# Patient Record
Sex: Male | Born: 2000 | Race: Black or African American | Hispanic: No | Marital: Single | Smoking: Never smoker
Health system: Southern US, Community
[De-identification: ages and names within clinical notes are randomized; demographics above are authoritative.]

## PROBLEM LIST (undated history)

## (undated) DIAGNOSIS — F913 Oppositional defiant disorder: Secondary | ICD-10-CM

## (undated) HISTORY — DX: Oppositional defiant disorder: F91.3

---
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
## 2021-01-11 ENCOUNTER — Emergency Department (HOSPITAL_COMMUNITY): Payer: Medicaid Other

## 2021-01-11 ENCOUNTER — Emergency Department (HOSPITAL_COMMUNITY)
Admission: EM | Admit: 2021-01-11 | Discharge: 2021-01-11 | Disposition: A | Discharge status: Home / Self Care | Payer: Medicaid Other | Attending: Emergency Medicine | Admitting: Emergency Medicine

## 2021-01-11 DIAGNOSIS — Z131 Encounter for screening for diabetes mellitus: Secondary | ICD-10-CM | POA: Diagnosis not present

## 2021-01-11 DIAGNOSIS — R55 Syncope and collapse: Secondary | ICD-10-CM | POA: Insufficient documentation

## 2021-01-11 DIAGNOSIS — R569 Unspecified convulsions: Secondary | ICD-10-CM | POA: Insufficient documentation

## 2021-01-11 DIAGNOSIS — I10 Essential (primary) hypertension: Secondary | ICD-10-CM | POA: Diagnosis not present

## 2021-01-11 DIAGNOSIS — R030 Elevated blood-pressure reading, without diagnosis of hypertension: Secondary | ICD-10-CM | POA: Diagnosis not present

## 2021-01-11 DIAGNOSIS — E669 Obesity, unspecified: Secondary | ICD-10-CM | POA: Diagnosis not present

## 2021-01-11 DIAGNOSIS — Z1329 Encounter for screening for other suspected endocrine disorder: Secondary | ICD-10-CM | POA: Diagnosis not present

## 2021-01-11 DIAGNOSIS — E559 Vitamin D deficiency, unspecified: Secondary | ICD-10-CM | POA: Diagnosis not present

## 2021-01-11 DIAGNOSIS — Z13220 Encounter for screening for lipoid disorders: Secondary | ICD-10-CM | POA: Diagnosis not present

## 2021-01-11 DIAGNOSIS — Z723 Lack of physical exercise: Secondary | ICD-10-CM | POA: Diagnosis not present

## 2021-01-11 DIAGNOSIS — R42 Dizziness and giddiness: Secondary | ICD-10-CM | POA: Diagnosis not present

## 2021-01-11 LAB — MAGNESIUM: Magnesium: 2.1 mg/dL (ref 1.7–2.4)

## 2021-01-11 LAB — ETHANOL: Alcohol, Ethyl (B): <10 mg/dL (ref <10)

## 2021-01-11 LAB — CBC
HCT: 50.2 % (ref 39.0–52.0)
Hemoglobin: 16.3 g/dL (ref 13.0–17.0)
MCH: 29.7 pg (ref 26.0–34.0)
MCHC: 32.5 g/dL (ref 30.0–36.0)
MCV: 91.4 fL (ref 80.0–100.0)
Platelets: 309 10*3/uL (ref 150–400)
RBC: 5.49 MIL/uL (ref 4.22–5.81)
RDW: 12.4 % (ref 11.5–15.5)
WBC: 5.5 10*3/uL (ref 4.0–10.5)
nRBC: 0 % (ref 0.0–0.2)

## 2021-01-11 LAB — BASIC METABOLIC PANEL
Anion gap: 10 (ref 5–15)
BUN: 22 mg/dL — ABNORMAL HIGH (ref 6–20)
CO2: 26 mmol/L (ref 22–32)
Calcium: 10 mg/dL (ref 8.9–10.3)
Chloride: 106 mmol/L (ref 98–111)
Creatinine, Ser: 1.04 mg/dL (ref 0.61–1.24)
GFR, Estimated: >60 mL/min (ref >60)
Glucose, Bld: 97 mg/dL (ref 70–99)
Potassium: 4.2 mmol/L (ref 3.5–5.1)
Sodium: 142 mmol/L (ref 135–145)

## 2021-01-11 NOTE — ED Provider Notes (Addendum)
Emergency Medicine Provider Triage Evaluation Note  Tony Jenkins , a 20 y.o. male  was evaluated in triage.  Pt complains of syncope.  Patient was at a doctor's office and having his blood drawn.  Reportedly syncopized, with questionable shaking activity.  Doctor's office used an ammonia capsule on him and he woke up immediately.  No postictal period.  No prior history of seizures.  He is feeling well and at baseline now.  Per chart review, patient does have a history of oppositional defiant disorder and autism.  He denies any history of medication use, no recent drug or alcohol use.  The reports they were sent here by the PCPs office due to the fact that the patient actually urinated on himself.  Mother reports family history of seizures but the patient does not have any history of seizures  Review of Systems  Positive: As above Negative: As above  Physical Exam  BP (!) 143/74 (BP Location: Left Arm)   Pulse 85   Temp 99.4 F (37.4 C) (Oral)   Resp 14   SpO2 99%  Gen:   Awake, no distress   Resp:  Normal effort  MSK:   Moves extremities without difficulty  Other:  Alert and oriented x4, moving all 4 extremities, answering questions appropriately  Medical Decision Making  Medically screening exam initiated at 3:21 PM.  Appropriate orders placed.  Tony Groot was informed that the remainder of the evaluation will be completed by another provider, this initial triage assessment does not replace that evaluation, and the importance of remaining in the ED until their evaluation is complete.      Mare Ferrari, PA-C 01/11/21 1521    Melene Plan, DO 01/11/21 1622

## 2021-01-11 NOTE — ED Provider Notes (Signed)
[facility] EMERGENCY DEPARTMENT Provider Note   CSN: [id number] Arrival date & time: 01/11/21  1515     History Chief Complaint  Patient presents with  . Loss of Consciousness    Tony Jenkins is a 20 y.o. male.  Mr. Kenneth was at the family medicine clinic getting a physical exam today.  He had been seated during a blood draw, and then he was dismissed to leave the clinic.  As he was walking towards the door, he passed out.  There is a questionable account by clinic staff of the patient having motor activity, and he was incontinent of urine.  He is currently back to baseline and is asymptomatic.  The history is provided by the patient and a relative (mother).  Loss of Consciousness Episode history:  Single Most recent episode:  Today Timing:  Constant Progression:  Resolved Chronicity:  New Context: blood draw   Witnessed: yes   Relieved by: smelling salts. Worsened by:  Nothing Ineffective treatments:  None tried Associated symptoms: seizures (possible motor activity)   Associated symptoms: no chest pain, no diaphoresis, no difficulty breathing, no fever, no focal sensory loss, no focal weakness, no palpitations, no recent injury, no shortness of breath and no vomiting   Associated symptoms comment:  Urinary incontinence Risk factors: no congenital heart disease and no seizures   Risk factors comment:  No first-degree relatives with sudden cardiac death.  He does not play sports.      Past Medical History:  Diagnosis Date  . ODD (oppositional defiant disorder) 11/2005   ? unclear diagnosis evaluated at [facility] ADHD clinic, mental health and youth focus    Patient Active Problem List   Diagnosis Date Noted  . No-show for appointment 11/21/2020  . Encounter for wellness examination in adult 11/20/2020  . Gynecomastia 07/12/2016  . Essential hypertension 04/26/2016  . Eczema 03/19/2013  . Xerosis of skin 10/24/2011  . ODD (oppositional defiant  disorder)/behavioral issues 11/24/2005    No past surgical history on file.     Family History  Problem Relation Age of Onset  . Asthma Mother   . Hypertension Mother   . Asthma Brother     Social History   Tobacco Use  . Smoking status: Never Smoker  . Smokeless tobacco: Never Used    Home Medications Prior to Admission medications   Not on File    Allergies    Patient has no known allergies.  Review of Systems   Review of Systems  Constitutional: Negative for chills, diaphoresis and fever.  HENT: Negative for ear pain and sore throat.   Eyes: Negative for pain and visual disturbance.  Respiratory: Negative for cough and shortness of breath.   Cardiovascular: Positive for syncope. Negative for chest pain and palpitations.  Gastrointestinal: Negative for abdominal pain and vomiting.  Genitourinary: Negative for dysuria and hematuria.  Musculoskeletal: Negative for arthralgias and back pain.  Skin: Negative for color change and rash.  Neurological: Positive for seizures (possible motor activity). Negative for focal weakness and syncope.  All other systems reviewed and are negative.   Physical Exam Updated Vital Signs BP 134/78   Pulse 80   Temp 99.4 F (37.4 C) (Oral)   Resp 16   Ht 6\' 2"  (1.88 m)   Wt 113.4 kg   SpO2 98%   BMI 32.10 kg/m   Physical Exam Vitals and nursing note reviewed.  Constitutional:      Appearance: He is well-developed.  HENT:  Head: Normocephalic and atraumatic.  Eyes:     Conjunctiva/sclera: Conjunctivae normal.  Cardiovascular:     Rate and Rhythm: Normal rate and regular rhythm.     Heart sounds: No murmur heard.   Pulmonary:     Effort: Pulmonary effort is normal. No respiratory distress.     Breath sounds: Normal breath sounds.  Abdominal:     Palpations: Abdomen is soft.     Tenderness: There is no abdominal tenderness.  Musculoskeletal:     Cervical back: Neck supple.  Skin:    General: Skin is warm and  dry.  Neurological:     General: No focal deficit present.     Mental Status: He is alert and oriented to person, place, and time. Mental status is at baseline.     Cranial Nerves: No cranial nerve deficit.     Sensory: No sensory deficit.     Motor: No weakness.     Coordination: Coordination normal.  Psychiatric:        Mood and Affect: Mood normal.        Behavior: Behavior normal.     ED Results / Procedures / Treatments   Labs (all labs ordered are listed, but only abnormal results are displayed) Labs Reviewed  BASIC METABOLIC PANEL - Abnormal; Notable for the following components:      Result Value   BUN 22 (*)    All other components within normal limits  CBC  MAGNESIUM  ETHANOL  RAPID URINE DRUG SCREEN, HOSP PERFORMED  URINALYSIS, ROUTINE W REFLEX MICROSCOPIC    EKG EKG Interpretation  Date/Time:  Thursday Jan 11 2021 15:20:21 EDT Ventricular Rate:  95 PR Interval:  128 QRS Duration: 82 QT Interval:  354 QTC Calculation: 444 R Axis:   86 Text Interpretation: Normal sinus rhythm Normal ECG normal axis no acute ischemia Confirmed by Pieter Partridge ([id number]) on 01/11/2021 8:49:25 PM   Radiology CT Head Wo Contrast  Result Date: 01/11/2021 CLINICAL DATA:  Seizure EXAM: CT HEAD WITHOUT CONTRAST TECHNIQUE: Contiguous axial images were obtained from the base of the skull through the vertex without intravenous contrast. COMPARISON:  None. FINDINGS: Brain: There is no mass, hemorrhage or extra-axial collection. The size and configuration of the ventricles and extra-axial CSF spaces are normal. The brain parenchyma is normal, without acute or chronic infarction. Vascular: No abnormal hyperdensity of the major intracranial arteries or dural venous sinuses. No intracranial atherosclerosis. Skull: The visualized skull base, calvarium and extracranial soft tissues are normal. Sinuses/Orbits: No fluid levels or advanced mucosal thickening of the visualized paranasal sinuses. No mastoid  or middle ear effusion. The orbits are normal. IMPRESSION: Normal head CT. Electronically Signed   By: Deatra Robinson M.D.   On: 01/11/2021 21:54    Procedures Procedures   Medications Ordered in ED Medications - No data to display  ED Course  I have reviewed the triage vital signs and the nursing notes.  Pertinent labs & imaging results that were available during my care of the patient were reviewed by me and considered in my medical decision making (see chart for details).    MDM Rules/Calculators/A&P                          The most likely etiology of the patient's symptoms was a syncopal episode which is in my opinion, more likely than a seizure.  I spoke with the mother about my opinion, and I said that the only thing  that seemed a little bit suspicious for seizure was the urinary incontinence.  Typically we would not perform a CT scan for uncomplicated syncope, but for first-time seizures, it would be warranted.  The mother is quite concerned about the patient I would like to proceed with a CT.  We did talk about the pros and cons of this modality including the risk of ionizing radiation.  CT normal, another work-up is reassuring.  Patient will be discharged. Final Clinical Impression(s) / ED Diagnoses Final diagnoses:  Syncope and collapse    Rx / DC Orders ED Discharge Orders    None       Koleen Distance, MD 01/11/21 2210

## 2021-01-11 NOTE — ED Triage Notes (Addendum)
Pt bib ems from MD office. Pt had a syncopal event after having blood drawn. Pt alert and oriented on arrival. VSS with EMS. Per mother, MD sent them here since pt was incontinent of urine they were concerned for Seizure. Mother denies hx of seizures.

## 2021-01-12 ENCOUNTER — Telehealth: Payer: Self-pay

## 2021-01-12 NOTE — Telephone Encounter (Signed)
Transition Care Management Follow-up Telephone Call  Date of discharge and from where: 01/11/2021 from [facility]  How have you been since you were released from the hospital? Pt is feeling much better. Call to PCP will be made to schedule a follow up apt.   Any questions or concerns? No  Items Reviewed:  Did the pt receive and understand the discharge instructions provided? Yes   Medications obtained and verified? Yes   Other? No   Any new allergies since your discharge? No   Dietary orders reviewed? n/a  Do you have support at home? Yes   Functional Questionnaire: (I = Independent and D = Dependent) ADLs: I  Bathing/Dressing- I  Meal Prep- I  Eating- I  Maintaining continence- I  Transferring/Ambulation- I  Managing Meds- I  Follow up appointments reviewed:   PCP Hospital f/u appt confirmed? No    Specialist Hospital f/u appt confirmed? No    Are transportation arrangements needed? No   If their condition worsens, is the pt aware to call PCP or go to the Emergency Dept.? Yes  Was the patient provided with contact information for the PCP's office or ED? Yes  Was to pt encouraged to call back with questions or concerns? Yes

## 2021-02-26 ENCOUNTER — Emergency Department (HOSPITAL_COMMUNITY)
Admission: EM | Admit: 2021-02-26 | Discharge: 2021-02-26 | Disposition: A | Discharge status: Home / Self Care | Payer: Medicaid Other | Attending: Emergency Medicine | Admitting: Emergency Medicine

## 2021-02-26 DIAGNOSIS — H60391 Other infective otitis externa, right ear: Secondary | ICD-10-CM | POA: Diagnosis not present

## 2021-02-26 DIAGNOSIS — I10 Essential (primary) hypertension: Secondary | ICD-10-CM | POA: Insufficient documentation

## 2021-02-26 DIAGNOSIS — H6091 Unspecified otitis externa, right ear: Secondary | ICD-10-CM | POA: Diagnosis not present

## 2021-02-26 DIAGNOSIS — H9201 Otalgia, right ear: Secondary | ICD-10-CM | POA: Diagnosis present

## 2021-02-26 MED ORDER — CIPROFLOXACIN-DEXAMETHASONE 0.3-0.1 % OT SUSP
4.0000 [drp] | Freq: Two times a day (BID) | OTIC | Status: DC
  Administered 2021-02-26: 4 [drp] via OTIC
  Filled 2021-02-26: qty 7.5

## 2021-02-26 NOTE — ED Notes (Signed)
Patient verbalized understanding of discharge instructions. Opportunity for questions and answers.  

## 2021-02-26 NOTE — ED Triage Notes (Signed)
Pt c/o ear pain for approx 2hrs. Pt endorses pressure in inner ear, muffled sounds.

## 2021-02-26 NOTE — Discharge Instructions (Addendum)
Use the ear drops 2 times a day for the next 5 days. If pain and drainage continue, follow up with your doctor for recheck. You can take Tylenol and/or ibuprofen for any additional discomfort.

## 2021-02-26 NOTE — ED Provider Notes (Signed)
[facility] EMERGENCY DEPARTMENT Provider Note   CSN: [id number] Arrival date & time: 02/26/21  0202     History Chief Complaint  Patient presents with   Ear Pain    Tony Jenkins is a 20 y.o. male.  Patient to ED for evaluation of right ear pain and drainage that started suddenly tonight around midnight. No bleeding. He reports his hearing is muffled. No fever, congestion, sore throat. No recent regular swimming or water exposure. No headache.   The history is provided by the patient. No language interpreter was used.      Past Medical History:  Diagnosis Date   ODD (oppositional defiant disorder) 11/2005   ? unclear diagnosis evaluated at [facility] ADHD clinic, mental health and youth focus    Patient Active Problem List   Diagnosis Date Noted   No-show for appointment 11/21/2020   Encounter for wellness examination in adult 11/20/2020   Gynecomastia 07/12/2016   Essential hypertension 04/26/2016   Eczema 03/19/2013   Xerosis of skin 10/24/2011   ODD (oppositional defiant disorder)/behavioral issues 11/24/2005    No past surgical history on file.     Family History  Problem Relation Age of Onset   Asthma Mother    Hypertension Mother    Asthma Brother     Social History   Tobacco Use   Smoking status: Never   Smokeless tobacco: Never    Home Medications Prior to Admission medications   Not on File    Allergies    Patient has no known allergies.  Review of Systems   Review of Systems  Constitutional:  Negative for fever.  HENT:  Positive for ear discharge and ear pain. Negative for congestion and sore throat.   Respiratory:  Negative for cough.   Neurological:  Negative for headaches.   Physical Exam Updated Vital Signs BP 129/82 (BP Location: Left Arm)   Pulse 95   Temp 98.1 F (36.7 C) (Oral)   Resp 17   SpO2 98%   Physical Exam Constitutional:      Appearance: He is well-developed.  HENT:     Head: Normocephalic and  atraumatic.     Left Ear: Tympanic membrane and ear canal normal.     Ears:     Comments: Right external canal is swollen, slightly erythematous with discharge present. No bleeding. No cerumen impaction. Visualized TM is non-erythematous, no middle ear effusion.  Pulmonary:     Effort: Pulmonary effort is normal.  Musculoskeletal:        General: Normal range of motion.     Cervical back: Normal range of motion.  Skin:    General: Skin is warm and dry.  Neurological:     Mental Status: He is alert and oriented to person, place, and time.    ED Results / Procedures / Treatments   Labs (all labs ordered are listed, but only abnormal results are displayed) Labs Reviewed - No data to display  EKG None  Radiology No results found.  Procedures Procedures   Medications Ordered in ED Medications  ciprofloxacin-dexamethasone (CIPRODEX) 0.3-0.1 % OTIC (EAR) suspension 4 drop (has no administration in time range)    ED Course  I have reviewed the triage vital signs and the nursing notes.  Pertinent labs & imaging results that were available during my care of the patient were reviewed by me and considered in my medical decision making (see chart for details).    MDM Rules/Calculators/A&P  Patient to ED with right ear pain and discharge. There is evidence external ear infection present. No TM rupture or infection. Ciprodex provided in the ED.   Final Clinical Impression(s) / ED Diagnoses Final diagnoses:  None   Otitis externa  Rx / DC Orders ED Discharge Orders     None        Elpidio Anis, PA-C 02/26/21 4259    Gilda Crease, MD 02/26/21 [phone]

## 2021-02-27 ENCOUNTER — Telehealth: Payer: Self-pay | Admitting: *Deleted

## 2021-02-27 NOTE — Telephone Encounter (Signed)
Transition Care Management Unsuccessful Follow-up Telephone Call  Date of discharge and from where:  02/26/2021 - [facility]  Attempts:  1st Attempt  Reason for unsuccessful TCM follow-up call:  Voice mail full

## 2021-02-28 NOTE — Telephone Encounter (Signed)
Transition Care Management Unsuccessful Follow-up Telephone Call  Date of discharge and from where:  02/26/2021 - [facility]  Attempts:  2nd Attempt  Reason for unsuccessful TCM follow-up call:  Voice mail full

## 2021-03-01 NOTE — Telephone Encounter (Signed)
Transition Care Management Unsuccessful Follow-up Telephone Call  Date of discharge and from where:  02/26/2021 - [facility]  Attempts:  3rd Attempt  Reason for unsuccessful TCM follow-up call:  Voice mail full

## 2021-08-06 IMAGING — CT CT HEAD W/O CM
4 series · 17 of 47 positions shown, 19 images · non-contrast
Comparison: None.

CLINICAL DATA: Seizure

EXAM:
CT HEAD WITHOUT CONTRAST
TECHNIQUE: Contiguous axial images were obtained from the base of the skull
through the vertex without intravenous contrast.

[Series 2: head without · axial · non-contrast · 0.48mm/px · z∈[+460,+586]mm · 7 of 35 slices shown, 9 images]
[im 5/35  brain]
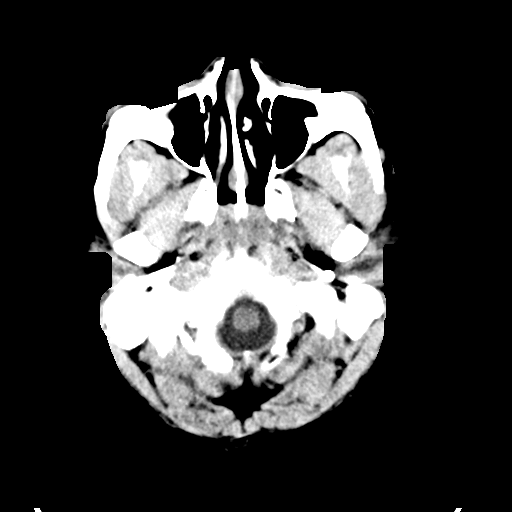
[im 5/35  bone]
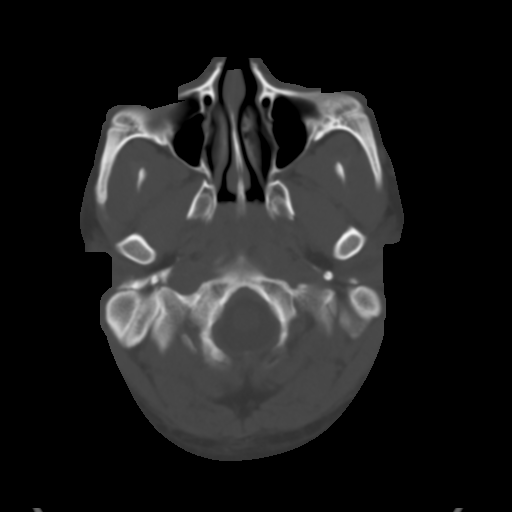
[im 9/35  brain]
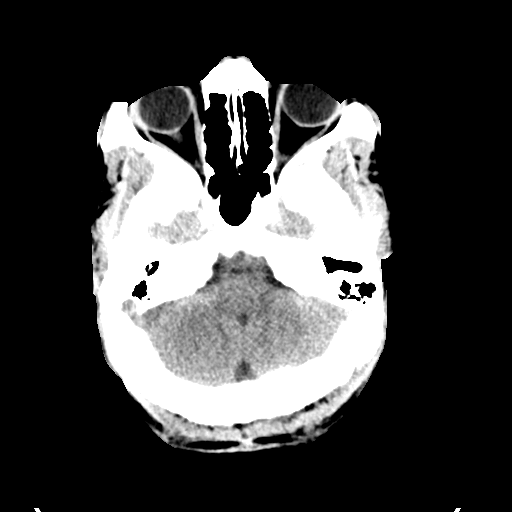
[im 13/35  brain]
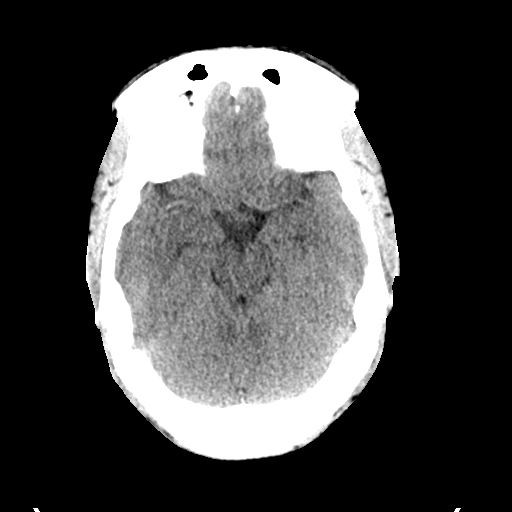
[im 18/35  brain]
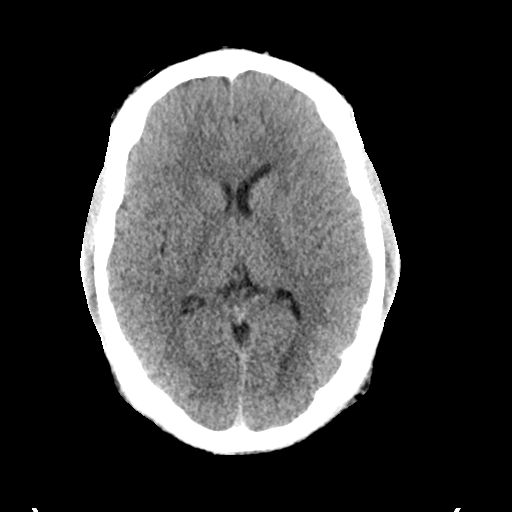
[im 22/35  brain]
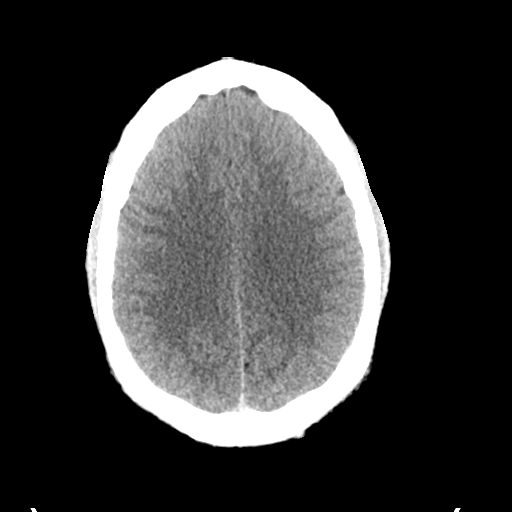
[im 22/35  bone]
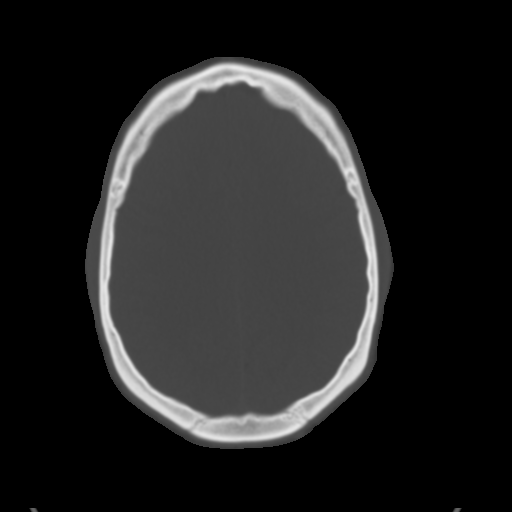
[im 26/35  brain]
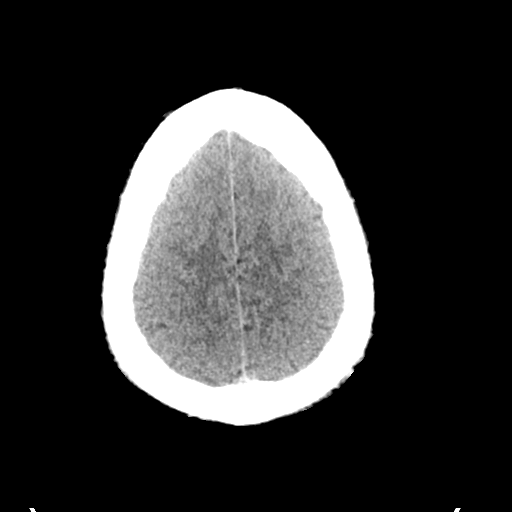
[im 30/35  brain]
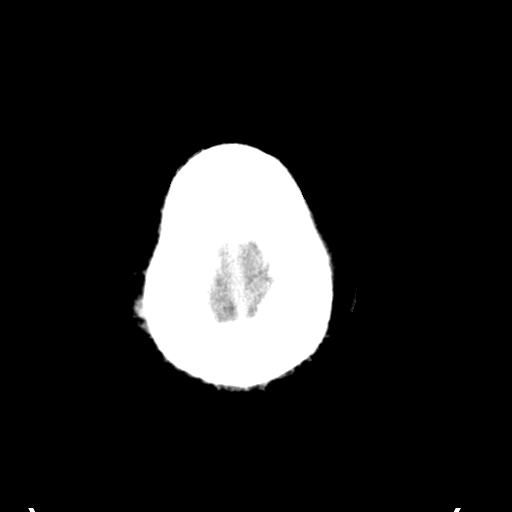

[Series 3: head bone · axial · 0.48mm/px · z∈[+456,+518]mm · 4 of 88 slices shown]
[im 9/88  bone]
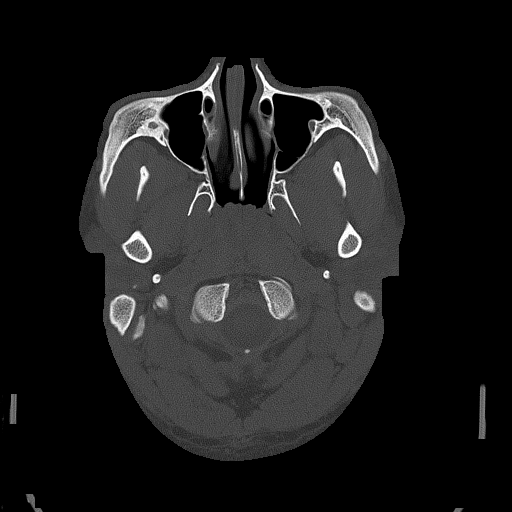
[im 18/88  bone]
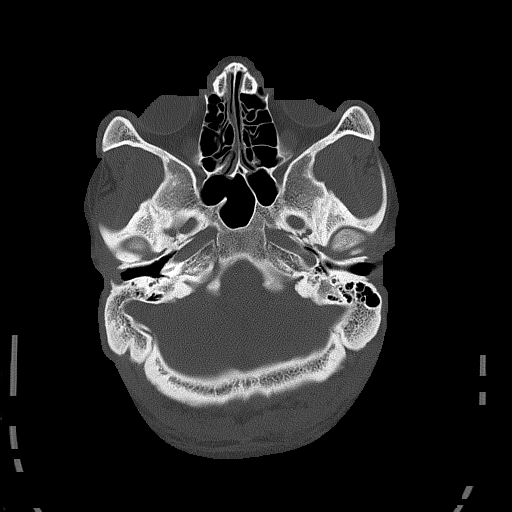
[im 27/88  bone]
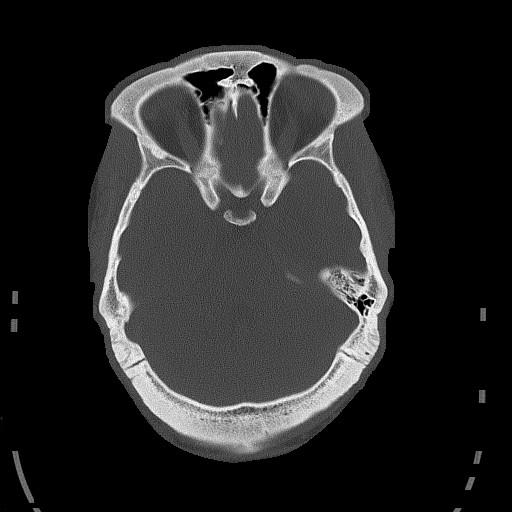
[im 40/88  bone]
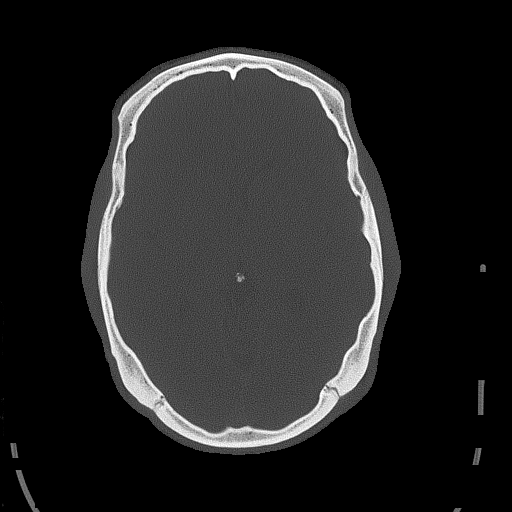

[Series 4: head without cor · coronal · non-contrast · 0.32mm/px · 3 of 69 slices shown]
[im 23/69  brain]
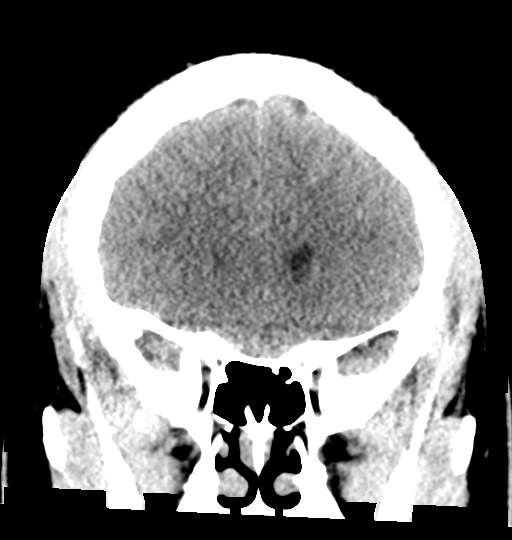
[im 31/69  brain]
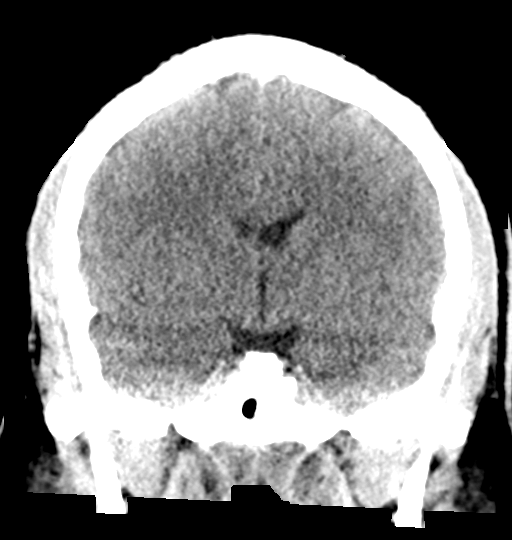
[im 38/69  brain]
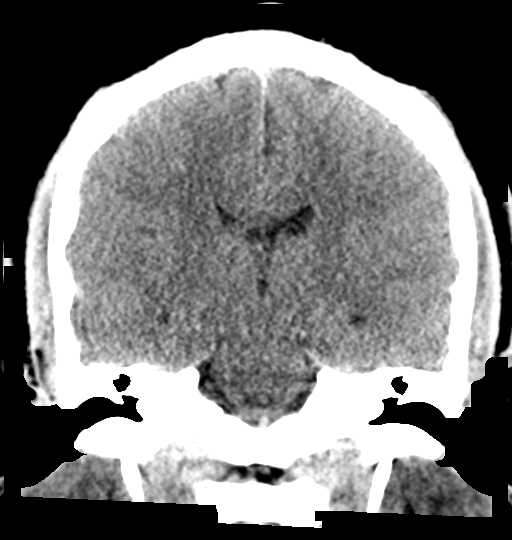

[Series 5: head without sag · sagittal · non-contrast · 0.34mm/px · 3 of 67 slices shown]
[im 23/67  brain]
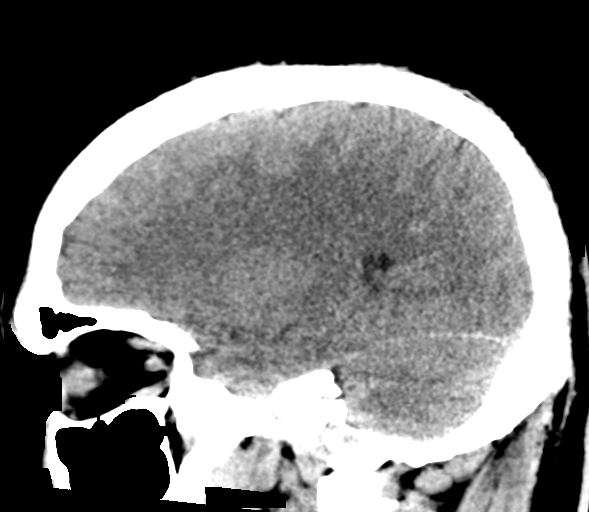
[im 34/67  brain]
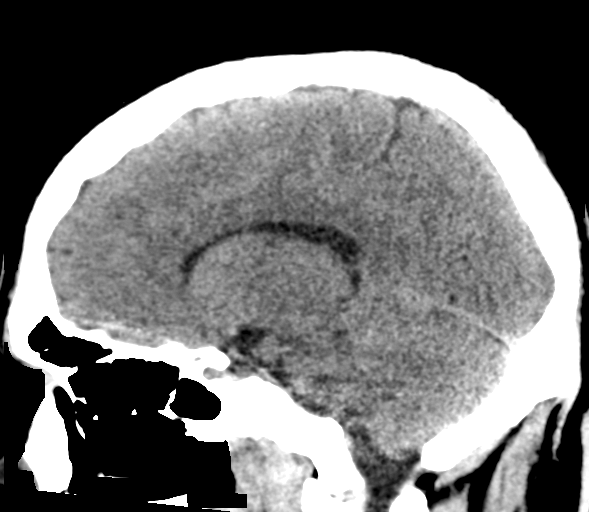
[im 45/67  brain]
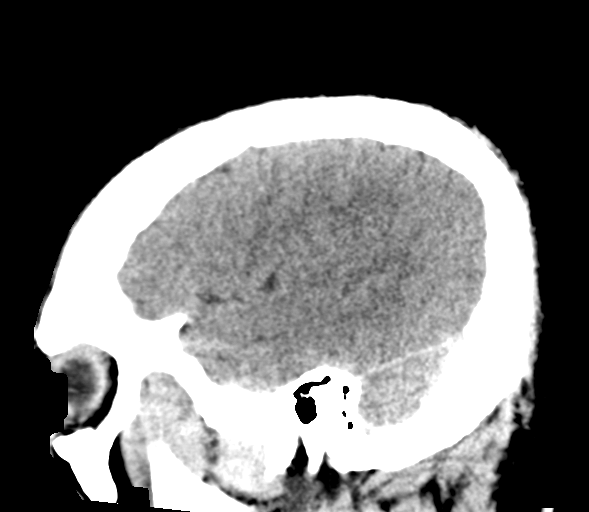

[17 of 47 positions shown; findings below may reference images not displayed]

FINDINGS: Brain: There is no mass, hemorrhage or extra-axial collection. The
size and configuration of the ventricles and extra-axial CSF spaces
are normal. The brain parenchyma is normal, without acute or chronic
infarction.

Vascular: No abnormal hyperdensity of the major intracranial
arteries or dural venous sinuses. No intracranial atherosclerosis.

Skull: The visualized skull base, calvarium and extracranial soft
tissues are normal.

Sinuses/Orbits: No fluid levels or advanced mucosal thickening of
the visualized paranasal sinuses. No mastoid or middle ear effusion.
The orbits are normal.
IMPRESSION: Normal head CT.

## 2023-01-13 NOTE — Progress Notes (Unsigned)
   New Patient Office Visit  Subjective    Patient ID: Tony Jenkins, male    DOB: 10-17-2000  Age: 22 y.o. MRN: [id number]  CC: No chief complaint on file.   HPI Tony Jenkins presents to establish care Former PCP [facility]  No outpatient encounter medications on file as of 01/14/2023.   No facility-administered encounter medications on file as of 01/14/2023.    Past Medical History:  Diagnosis Date   ODD (oppositional defiant disorder) 11/2005   ? unclear diagnosis evaluated at [facility] ADHD clinic, mental health and youth focus    No past surgical history on file.  Family History  Problem Relation Age of Onset   Asthma Mother    Hypertension Mother    Asthma Brother     Social History   Socioeconomic History   Marital status: Single    Spouse name: Not on file   Number of children: Not on file   Years of education: Not on file   Highest education level: Not on file  Occupational History   Not on file  Tobacco Use   Smoking status: Never   Smokeless tobacco: Never  Substance and Sexual Activity   Alcohol use: Not on file   Drug use: Not on file   Sexual activity: Not on file  Other Topics Concern   Not on file  Social History Narrative   Lives at home with mom, older brother Brain Hilts) and 23 yo sister Lorinda Creed).   Attends [employer].   Lives in [place].    Spends weekends in [place] with [employer].    Both parents unemployed   Social Determinants of Corporate investment banker Strain: Not on file  Food Insecurity: Not on file  Transportation Needs: Not on file  Physical Activity: Not on file  Stress: Not on file  Social Connections: Not on file  Intimate Partner Violence: Not on file    ROS      Objective    There were no vitals taken for this visit.  Physical Exam  {Labs (Optional):23779}    Assessment & Plan:   Problem List Items Addressed This Visit   None   No follow-ups on file.   Shan Levans, MD

## 2023-01-14 ENCOUNTER — Encounter: Payer: Self-pay | Admitting: Critical Care Medicine

## 2023-01-14 ENCOUNTER — Ambulatory Visit: Payer: Medicaid Other | Attending: Critical Care Medicine | Admitting: Critical Care Medicine

## 2023-01-14 VITALS — BP 128/78 | HR 74 | Ht 73.5 in | Wt 287.6 lb

## 2023-01-14 DIAGNOSIS — Z139 Encounter for screening, unspecified: Secondary | ICD-10-CM

## 2023-01-14 DIAGNOSIS — F913 Oppositional defiant disorder: Secondary | ICD-10-CM | POA: Diagnosis not present

## 2023-01-14 DIAGNOSIS — Z0000 Encounter for general adult medical examination without abnormal findings: Secondary | ICD-10-CM | POA: Diagnosis not present

## 2023-01-14 DIAGNOSIS — I10 Essential (primary) hypertension: Secondary | ICD-10-CM

## 2023-01-14 NOTE — Assessment & Plan Note (Signed)
Blood pressure not currently elevated will monitor off medication

## 2023-01-14 NOTE — Patient Instructions (Signed)
No medications needed  Health screening labs will be obtained  Return to Dr. Delford Field 1 year unless you are having other new symptoms then call us so we can see you here in the office instead of in the emergency room

## 2023-01-14 NOTE — Assessment & Plan Note (Signed)
Currently not present will monitor

## 2023-01-14 NOTE — Assessment & Plan Note (Signed)
Patient agreed to lab screening

## 2023-01-15 ENCOUNTER — Other Ambulatory Visit: Payer: Self-pay | Admitting: Critical Care Medicine

## 2023-01-15 LAB — COMPREHENSIVE METABOLIC PANEL
ALT: 19 IU/L (ref 0–44)
AST: 18 IU/L (ref 0–40)
Albumin/Globulin Ratio: 2.2 (ref 1.2–2.2)
Albumin: 4.9 g/dL (ref 4.3–5.2)
Alkaline Phosphatase: 60 IU/L (ref 44–121)
BUN/Creatinine Ratio: 15 (ref 9–20)
BUN: 13 mg/dL (ref 6–20)
Bilirubin Total: 0.9 mg/dL (ref 0.0–1.2)
CO2: 24 mmol/L (ref 20–29)
Calcium: 9.4 mg/dL (ref 8.7–10.2)
Chloride: 106 mmol/L (ref 96–106)
Creatinine, Ser: 0.88 mg/dL (ref 0.76–1.27)
Globulin, Total: 2.2 g/dL (ref 1.5–4.5)
Glucose: 95 mg/dL (ref 70–99)
Potassium: 3.8 mmol/L (ref 3.5–5.2)
Sodium: 146 mmol/L — ABNORMAL HIGH (ref 134–144)
Total Protein: 7.1 g/dL (ref 6.0–8.5)
eGFR: 125 mL/min/{1.73_m2} (ref >59)

## 2023-01-15 LAB — LIPID PANEL
Chol/HDL Ratio: 4.7 ratio (ref 0.0–5.0)
Cholesterol, Total: 211 mg/dL — ABNORMAL HIGH (ref 100–199)
HDL: 45 mg/dL (ref >39)
LDL Chol Calc (NIH): 156 mg/dL — ABNORMAL HIGH (ref 0–99)
Triglycerides: 59 mg/dL (ref 0–149)
VLDL Cholesterol Cal: 10 mg/dL (ref 5–40)

## 2023-01-15 LAB — HCV AB W REFLEX TO QUANT PCR: HCV Ab: NONREACTIVE

## 2023-01-15 LAB — CBC WITH DIFFERENTIAL/PLATELET
Basophils Absolute: 0 10*3/uL (ref 0.0–0.2)
Basos: 1 %
EOS (ABSOLUTE): 0.1 10*3/uL (ref 0.0–0.4)
Eos: 2 %
Hematocrit: 43.6 % (ref 37.5–51.0)
Hemoglobin: 14.6 g/dL (ref 13.0–17.7)
Immature Grans (Abs): 0 10*3/uL (ref 0.0–0.1)
Immature Granulocytes: 0 %
Lymphocytes Absolute: 2.2 10*3/uL (ref 0.7–3.1)
Lymphs: 43 %
MCH: 30.1 pg (ref 26.6–33.0)
MCHC: 33.5 g/dL (ref 31.5–35.7)
MCV: 90 fL (ref 79–97)
Monocytes Absolute: 0.5 10*3/uL (ref 0.1–0.9)
Monocytes: 10 %
Neutrophils Absolute: 2.3 10*3/uL (ref 1.4–7.0)
Neutrophils: 44 %
Platelets: 263 10*3/uL (ref 150–450)
RBC: 4.85 x10E6/uL (ref 4.14–5.80)
RDW: 12.9 % (ref 11.6–15.4)
WBC: 5.1 10*3/uL (ref 3.4–10.8)

## 2023-01-15 LAB — HIV ANTIBODY (ROUTINE TESTING W REFLEX): HIV Screen 4th Generation wRfx: NONREACTIVE

## 2023-01-15 LAB — HCV INTERPRETATION

## 2023-01-15 LAB — HEMOGLOBIN A1C
Est. average glucose Bld gHb Est-mCnc: 111 mg/dL
Hgb A1c MFr Bld: 5.5 % (ref 4.8–5.6)

## 2023-01-15 MED ORDER — ATORVASTATIN CALCIUM 10 MG PO TABS: 10.0000 mg | ORAL_TABLET | Freq: Every day | ORAL | 3 refills | Status: AC

## 2023-01-15 NOTE — Progress Notes (Signed)
Call pt MOM, tell her liver kidney normal, cholesterol is very high, hiv hep c neg, no diabetes, blood count normal   I sent low dose cholesterol pill to pharmacy

## 2023-01-17 ENCOUNTER — Telehealth: Payer: Self-pay

## 2023-01-17 NOTE — Telephone Encounter (Signed)
-----   Message from Storm Frisk, MD sent at 01/15/2023  6:08 AM EDT ----- Call pt MOM, tell her liver kidney normal, cholesterol is very high, hiv hep c neg, no diabetes, blood count normal   I sent low dose cholesterol pill to pharmacy

## 2023-01-17 NOTE — Telephone Encounter (Signed)
Pt was called and vm was left, Information has been sent to nurse pool.   

## 2024-01-14 ENCOUNTER — Ambulatory Visit: Payer: Medicaid Other | Admitting: Critical Care Medicine

## 2024-01-14 ENCOUNTER — Ambulatory Visit: Payer: Medicaid Other | Admitting: Family Medicine
